# Patient Record
Sex: Female | Born: 1970 | Race: Black or African American | Hispanic: No
Health system: Southern US, Community
[De-identification: ages and names within clinical notes are randomized; demographics above are authoritative.]

## PROBLEM LIST (undated history)

## (undated) ENCOUNTER — Emergency Department (HOSPITAL_BASED_OUTPATIENT_CLINIC_OR_DEPARTMENT_OTHER): Admission: EM | Payer: No Typology Code available for payment source | Source: Home / Self Care

## (undated) DIAGNOSIS — I10 Essential (primary) hypertension: Secondary | ICD-10-CM

## (undated) DIAGNOSIS — E669 Obesity, unspecified: Secondary | ICD-10-CM

---

## 2011-07-04 ENCOUNTER — Emergency Department (HOSPITAL_BASED_OUTPATIENT_CLINIC_OR_DEPARTMENT_OTHER)
Admission: EM | Admit: 2011-07-04 | Discharge: 2011-07-04 | Disposition: A | Discharge status: Home / Self Care | Payer: Worker's Compensation | Attending: Emergency Medicine | Admitting: Emergency Medicine

## 2011-07-04 ENCOUNTER — Emergency Department (INDEPENDENT_AMBULATORY_CARE_PROVIDER_SITE_OTHER): Payer: Worker's Compensation

## 2011-07-04 ENCOUNTER — Encounter (HOSPITAL_BASED_OUTPATIENT_CLINIC_OR_DEPARTMENT_OTHER): Payer: Self-pay | Admitting: *Deleted

## 2011-07-04 DIAGNOSIS — M899 Disorder of bone, unspecified: Secondary | ICD-10-CM

## 2011-07-04 DIAGNOSIS — W19XXXA Unspecified fall, initial encounter: Secondary | ICD-10-CM

## 2011-07-04 DIAGNOSIS — M25469 Effusion, unspecified knee: Secondary | ICD-10-CM

## 2011-07-04 DIAGNOSIS — W010XXA Fall on same level from slipping, tripping and stumbling without subsequent striking against object, initial encounter: Secondary | ICD-10-CM | POA: Insufficient documentation

## 2011-07-04 DIAGNOSIS — M25569 Pain in unspecified knee: Secondary | ICD-10-CM

## 2011-07-04 DIAGNOSIS — M25579 Pain in unspecified ankle and joints of unspecified foot: Secondary | ICD-10-CM | POA: Insufficient documentation

## 2011-07-04 DIAGNOSIS — Y9329 Activity, other involving ice and snow: Secondary | ICD-10-CM | POA: Insufficient documentation

## 2011-07-04 HISTORY — DX: Essential (primary) hypertension: I10

## 2011-07-04 MED ORDER — HYDROCODONE-ACETAMINOPHEN 5-325 MG PO TABS: 2.0000 | ORAL_TABLET | Freq: Four times a day (QID) | ORAL | Status: AC | PRN

## 2011-07-04 MED ORDER — IBUPROFEN 600 MG PO TABS: 600.0000 mg | ORAL_TABLET | Freq: Four times a day (QID) | ORAL | Status: AC | PRN

## 2011-07-04 MED ORDER — HYDROCODONE-ACETAMINOPHEN 5-325 MG PO TABS
1.0000 | ORAL_TABLET | Freq: Once | ORAL | Status: AC
  Administered 2011-07-04: 1 via ORAL
  Filled 2011-07-04: qty 1

## 2011-07-04 NOTE — ED Notes (Signed)
States slipped on ice at work yesterday- c/o right knee pain

## 2011-07-04 NOTE — ED Provider Notes (Signed)
History   This chart was scribed for Forbes Cellar, MD by Charolett Bumpers . The patient was seen in room [facility] and the patient's care was started at 7:39pm.   CSN: [id number]  Arrival date & time 07/04/11  1743   First MD Initiated Contact with Patient 07/04/11 1921      Chief Complaint  Patient presents with  . Knee Pain  . Fall    (Consider location/radiation/quality/duration/timing/severity/associated sxs/prior treatment) HPI Nicole Graham is a 41 y.o. female who presents to the Emergency Department complaining of constant, moderate right knee pain after a fall that occurred yesterday. Patient reports that she slipped on ice, causing her to fall. Patient rates right knee pain as 9/10 and states it radiates to her right ankle. Patient denies numbness, tingling, or weakness in her extremities. Patient denies LOC or head trauma. Patient reports being able to ambulate some. Patient also reports associated right knee swelling. Patient has not taken anything for her knee pain. Ambulatory with a limp.   ED Notes, ED Provider Notes from 07/04/11 0000 to 07/04/11 18:11:02       Joss Stoney Bang, RN 07/04/2011 18:06      States slipped on ice at work yesterday- c/o right knee pain      Past Medical History  Diagnosis Date  . Hypertension     Past Surgical History  Procedure Date  . Cesarean section     History reviewed. No pertinent family history.  History  Substance Use Topics  . Smoking status: Never Smoker   . Smokeless tobacco: Not on file  . Alcohol Use: Yes     occasional    OB History    Grav Para Term Preterm Abortions TAB SAB Ect Mult Living                  Review of Systems A complete 10 system review of systems was obtained and is otherwise negative except as noted in the HPI and PMH.   Allergies  Review of patient's allergies indicates no known allergies.  Home Medications   Current Outpatient Rx  Name Route Sig Dispense Refill  .  LISINOPRIL-HYDROCHLOROTHIAZIDE 20-25 MG PO TABS Oral Take by mouth daily.    Marland Kitchen NIFEDIPINE ER OSMOTIC 60 MG PO TB24 Oral Take 60 mg by mouth 2 (two) times daily.    Marland Kitchen HYDROCODONE-ACETAMINOPHEN 5-325 MG PO TABS Oral Take 2 tablets by mouth every 6 (six) hours as needed for pain. 10 tablet 0  . IBUPROFEN 600 MG PO TABS Oral Take 1 tablet (600 mg total) by mouth every 6 (six) hours as needed for pain. 30 tablet 0    BP 119/78  Pulse 87  Temp(Src) 99.4 F (37.4 C) (Oral)  Resp 20  Ht 5\' 3"  (1.6 m)  Wt 280 lb (127.007 kg)  BMI 49.60 kg/m2  SpO2 100%  LMP 07/02/2011  Physical Exam  Nursing note and vitals reviewed. Constitutional: She is oriented to person, place, and time. She appears well-developed and well-nourished. No distress.  HENT:  Head: Normocephalic and atraumatic.  Eyes: EOM are normal. Pupils are equal, round, and reactive to light.  Neck: Normal range of motion. Neck supple. No tracheal deviation present.  Cardiovascular: Normal rate, regular rhythm and normal heart sounds.  Exam reveals no gallop and no friction rub.   No murmur heard. Pulmonary/Chest: Effort normal and breath sounds normal. No respiratory distress. She has no wheezes. She has no rales.  Abdominal: Soft. Bowel sounds are normal.  She exhibits no distension and no mass. There is no tenderness. There is no rebound and no guarding.  Musculoskeletal: Normal range of motion. She exhibits no edema.       Swelling to right medial knee. Right knee is tender to palpation at medial joint line. No instability. Swelling to right ankle lateral malleolus.  Achilles tendon intact. Minimal ecchymosis to right ankle.  Distal pulses intact. Cap refill < 3 sec. Gross sensation intact  Neurological: She is alert and oriented to person, place, and time. No sensory deficit.  Skin: Skin is warm and dry.  Psychiatric: She has a normal mood and affect. Her behavior is normal.    ED Course  Procedures (including critical care  time)  DIAGNOSTIC STUDIES: Oxygen Saturation is 100% on room air, normal by my interpretation.    COORDINATION OF CARE:  1945: Medication Orders: Hydrocodone-acetaminophen    Labs Reviewed - No data to display Dg Ankle Complete Right  07/04/2011  *RADIOLOGY REPORT*  Clinical Data: 41 year old female status post fall with pain.  RIGHT ANKLE - COMPLETE 3+ VIEW  Comparison: None.  Findings: No definite joint effusion.  Pes planus.  Degenerative changes about the talus.  Mortise joint alignment preserved.  Talar dome intact.  No acute fracture identified.  IMPRESSION: No acute fracture or dislocation identified about the right ankle. Degenerative changes noted.  Original Report Authenticated By: Harley Hallmark, M.D.   Dg Knee Complete 4 Views Right  07/04/2011  *RADIOLOGY REPORT*  Clinical Data: 41 year old female status post fall with pain.  RIGHT KNEE - COMPLETE 4+ VIEW  Comparison: None.  Findings: Evidence of suprapatellar joint effusion.  The patella appears intact.  Joint spaces are preserved but there is a degree of tricompartmental degenerative spurring.  No acute fracture identified.  IMPRESSION: Joint effusion.  Degenerative spurring.  No acute fracture identified about the right knee.  Original Report Authenticated By: Ulla Potash III, M.D.    1. Knee pain   2. Ankle pain   3. Fall     MDM  S/p mechanical fall with ankle pain, knee pain. No apparent fx on XR. RICE/pain control. PMD f/u as needed. Precautions for return.  I personally performed the services described in this documentation, which was scribed in my presence. The recorded information has been reviewed and considered.       Forbes Cellar, MD 07/04/11 [phone]

## 2012-09-23 ENCOUNTER — Emergency Department (HOSPITAL_BASED_OUTPATIENT_CLINIC_OR_DEPARTMENT_OTHER)
Admission: EM | Admit: 2012-09-23 | Discharge: 2012-09-23 | Disposition: A | Discharge status: Home / Self Care | Payer: Managed Care, Other (non HMO) | Attending: Emergency Medicine | Admitting: Emergency Medicine

## 2012-09-23 ENCOUNTER — Emergency Department (HOSPITAL_BASED_OUTPATIENT_CLINIC_OR_DEPARTMENT_OTHER): Payer: Managed Care, Other (non HMO)

## 2012-09-23 ENCOUNTER — Encounter (HOSPITAL_BASED_OUTPATIENT_CLINIC_OR_DEPARTMENT_OTHER): Payer: Self-pay

## 2012-09-23 DIAGNOSIS — Z79899 Other long term (current) drug therapy: Secondary | ICD-10-CM | POA: Insufficient documentation

## 2012-09-23 DIAGNOSIS — M549 Dorsalgia, unspecified: Secondary | ICD-10-CM | POA: Insufficient documentation

## 2012-09-23 DIAGNOSIS — E669 Obesity, unspecified: Secondary | ICD-10-CM | POA: Insufficient documentation

## 2012-09-23 DIAGNOSIS — K219 Gastro-esophageal reflux disease without esophagitis: Secondary | ICD-10-CM | POA: Insufficient documentation

## 2012-09-23 DIAGNOSIS — R072 Precordial pain: Secondary | ICD-10-CM | POA: Insufficient documentation

## 2012-09-23 DIAGNOSIS — Z3202 Encounter for pregnancy test, result negative: Secondary | ICD-10-CM | POA: Insufficient documentation

## 2012-09-23 DIAGNOSIS — I10 Essential (primary) hypertension: Secondary | ICD-10-CM | POA: Insufficient documentation

## 2012-09-23 HISTORY — DX: Obesity, unspecified: E66.9

## 2012-09-23 LAB — CBC
MCHC: 33.1 g/dL (ref 30.0–36.0)
Platelets: 365 10*3/uL (ref 150–400)
RDW: 14.3 % (ref 11.5–15.5)
WBC: 8.1 10*3/uL (ref 4.0–10.5)

## 2012-09-23 LAB — PREGNANCY, URINE: Preg Test, Ur: NEGATIVE

## 2012-09-23 LAB — BASIC METABOLIC PANEL
Chloride: 101 mEq/L (ref 96–112)
GFR calc Af Amer: >90 mL/min (ref >90)
GFR calc non Af Amer: 79 mL/min — ABNORMAL LOW (ref >90)
Potassium: 4.8 mEq/L (ref 3.5–5.1)
Sodium: 136 mEq/L (ref 135–145)

## 2012-09-23 LAB — LIPASE, BLOOD: Lipase: 25 U/L (ref 11–59)

## 2012-09-23 LAB — TROPONIN I: Troponin I: <0.3 ng/mL (ref <0.30)

## 2012-09-23 MED ORDER — FAMOTIDINE 20 MG PO TABS: 20.0000 mg | ORAL_TABLET | Freq: Two times a day (BID) | ORAL | Status: AC

## 2012-09-23 MED ORDER — GI COCKTAIL ~~LOC~~
30.0000 mL | Freq: Once | ORAL | Status: AC
  Administered 2012-09-23: 30 mL via ORAL
  Filled 2012-09-23: qty 30

## 2012-09-23 MED ORDER — ALUM & MAG HYDROXIDE-SIMETH 200-200-20 MG/5ML PO SUSP: 10.0000 mL | Freq: Four times a day (QID) | ORAL | Status: AC | PRN

## 2012-09-23 MED ORDER — ASPIRIN 81 MG PO CHEW
324.0000 mg | CHEWABLE_TABLET | Freq: Once | ORAL | Status: AC
  Administered 2012-09-23: 324 mg via ORAL
  Filled 2012-09-23: qty 4

## 2012-09-23 NOTE — ED Notes (Signed)
Pt states that she had onset of sharp shooting chest pain, radiating to the back at about 0900 this morning.  Pt states that she called PCP and they advised her to take Tums, or Gas X to see if it relieved the pain.  Pt did drink ginger ale reported some improvement after drinking it. No pain at present, family hx of heart dz and MI.

## 2012-09-23 NOTE — ED Provider Notes (Signed)
History     CSN: [id number]  Arrival date & time 09/23/12  1551   First MD Initiated Contact with Patient 09/23/12 1609      Chief Complaint  Patient presents with  . Chest Pain    (Consider location/radiation/quality/duration/timing/severity/associated sxs/prior treatment) HPI Comments: Patient is a 42 year old woman who complains of chest pain. She says she has a had a sharp pain in the lower substernal region that went through to her back. This happened around 9 this morning. His last briefs. One or 2 seconds. She had 2 more times than this morning. She tried ginger ale to see if appropriate would help and that helped a little bit. She called her doctor's office, and was advised that it could be a number of things, such as esophageal reflux or heart trouble. Her mother died of a heart attack as did her maternal grandmother. Therefore she sought evaluation. She has no history of heart disease, but does have hypertension.  Patient is a 42 y.o. female presenting with chest pain. The history is provided by the patient. No language interpreter was used.  Chest Pain Pain location:  Substernal area Pain quality: sharp   Pain radiates to:  Mid back Pain radiates to the back: yes   Pain severity:  Moderate Onset quality:  Sudden Duration: 5-10 seconds. Timing:  Intermittent Progression:  Unchanged Chronicity:  New Context comment:  No apparent precipitating factor. Relieved by:  Nothing Worsened by:  Nothing tried Ineffective treatments:  None tried (She took ginger ale, which helped her to burp but didn't really relieve her pain.) Associated symptoms: back pain   Associated symptoms: no fever   Risk factors: hypertension   Risk factors: no smoking     Past Medical History  Diagnosis Date  . Hypertension   . Obesity     Past Surgical History  Procedure Laterality Date  . Cesarean section      History reviewed. No pertinent family history.  History  Substance Use Topics   . Smoking status: Passive Smoke Exposure - Never Smoker  . Smokeless tobacco: Never Used  . Alcohol Use: Yes     Comment: occasional    OB History   Grav Para Term Preterm Abortions TAB SAB Ect Mult Living                  Review of Systems  Constitutional: Negative.  Negative for fever and chills.  HENT: Negative.   Eyes: Negative.   Respiratory: Negative.   Cardiovascular: Positive for chest pain.  Gastrointestinal: Negative.   Genitourinary: Negative.   Musculoskeletal: Positive for back pain.  Skin: Negative.   Neurological: Negative.   Psychiatric/Behavioral: Negative.     Allergies  Review of patient's allergies indicates no known allergies.  Home Medications   Current Outpatient Rx  Name  Route  Sig  Dispense  Refill  . lisinopril-hydrochlorothiazide (PRINZIDE,ZESTORETIC) 20-25 MG per tablet   Oral   Take by mouth daily.         Marland Kitchen NIFEdipine (PROCARDIA XL/ADALAT-CC) 60 MG 24 hr tablet   Oral   Take 60 mg by mouth 2 (two) times daily.           BP 145/91  Pulse 101  Temp(Src) 98.9 F (37.2 C) (Oral)  Resp 20  Ht 5\' 3"  (1.6 m)  Wt 270 lb (122.471 kg)  BMI 47.84 kg/m2  SpO2 100%  LMP 09/04/2012  Physical Exam  Nursing note and vitals reviewed. Constitutional: She is oriented to  person, place, and time. No distress.  BP 145/91. Patient is a middle-aged woman who is mild to moderately obese.  HENT:  Head: Normocephalic and atraumatic.  Right Ear: External ear normal.  Left Ear: External ear normal.  Mouth/Throat: Oropharynx is clear and moist.  Eyes: Conjunctivae and EOM are normal. Pupils are equal, round, and reactive to light.  Neck: Normal range of motion. Neck supple.  Cardiovascular: Normal rate, regular rhythm and normal heart sounds.   Pulmonary/Chest: Effort normal and breath sounds normal. She exhibits no tenderness.  Abdominal: Soft. Bowel sounds are normal.  Musculoskeletal: Normal range of motion. She exhibits no edema and no  tenderness.  Neurological: She is alert and oriented to person, place, and time.  No sensory or motor deficit.  Skin: Skin is warm and dry.  Psychiatric: She has a normal mood and affect. Her behavior is normal.    ED Course  Procedures (including critical care time)  Labs Reviewed  CBC  BASIC METABOLIC PANEL  TROPONIN I  PREGNANCY, URINE   4:10 PM  Date: 09/23/2012  Rate: 94  Rhythm: normal sinus rhythm  QRS Axis: right  Intervals: normal QRS:  Poor R wave progression in precordial leads suggests possible old anterior myocardial infarction. Left atrial enlargement.  ST/T Wave abnormalities: normal  Conduction Disutrbances:none  Narrative Interpretation: Abnormal EKG  Old EKG Reviewed: none available  4:53 PM She was seen and had physical examination. Laboratory tests were ordered. Baby aspirins were ordered, had a GI cocktail was ordered.  6:16 PM Results for orders placed during the hospital encounter of 09/23/12  CBC      Result Value Range   WBC 8.1  4.0 - 10.5 K/uL   RBC 4.29  3.87 - 5.11 MIL/uL   Hemoglobin 11.9 (*) 12.0 - 15.0 g/dL   HCT 56.2  13.0 - 86.5 %   MCV 83.9  78.0 - 100.0 fL   MCH 27.7  26.0 - 34.0 pg   MCHC 33.1  30.0 - 36.0 g/dL   RDW 78.4  69.6 - 29.5 %   Platelets 365  150 - 400 K/uL  BASIC METABOLIC PANEL      Result Value Range   Sodium 136  135 - 145 mEq/L   Potassium 4.8  3.5 - 5.1 mEq/L   Chloride 101  96 - 112 mEq/L   CO2 23  19 - 32 mEq/L   Glucose, Bld 99  70 - 99 mg/dL   BUN 16  6 - 23 mg/dL   Creatinine, Ser 2.84  0.50 - 1.10 mg/dL   Calcium 9.4  8.4 - 13.2 mg/dL   GFR calc non Af Amer 79 (*) >90 mL/min   GFR calc Af Amer >90  >90 mL/min  TROPONIN I      Result Value Range   Troponin I <0.30  <0.30 ng/mL  PREGNANCY, URINE      Result Value Range   Preg Test, Ur NEGATIVE  NEGATIVE  LIPASE, BLOOD      Result Value Range   Lipase 25  11 - 59 U/L   Dg Chest 2 View  09/23/2012  *RADIOLOGY REPORT*  Clinical Data: Chest pain   CHEST - 2 VIEW  Comparison: None.  Findings: The lungs are clear without focal consolidation, edema, effusion or pneumothorax.  Cardiopericardial silhouette is within normal limits for size.  Telemetry leads overlie the chest. Imaged bony structures of the thorax are intact.  IMPRESSION: Normal exam.   Original Report Authenticated By:  Kennith Center, M.D.     6:16 PM Lab tests entirely negative.  I suspect her symptoms are from esophageal reflux.  Recommended Pepcid 20 mg bid, Mylanta 10 cc pc and hs.   1. GERD (gastroesophageal reflux disease)          Carleene Cooper III, MD 09/23/12 [phone]

## 2013-05-01 IMAGING — CR DG ANKLE COMPLETE 3+V*R*
4 series · 4 of 4 positions shown · non-contrast
Comparison: None.

CLINICAL DATA: 40-year-old female status post fall with pain.

RIGHT ANKLE - COMPLETE 3+ VIEW

[t ankle joint ap right]
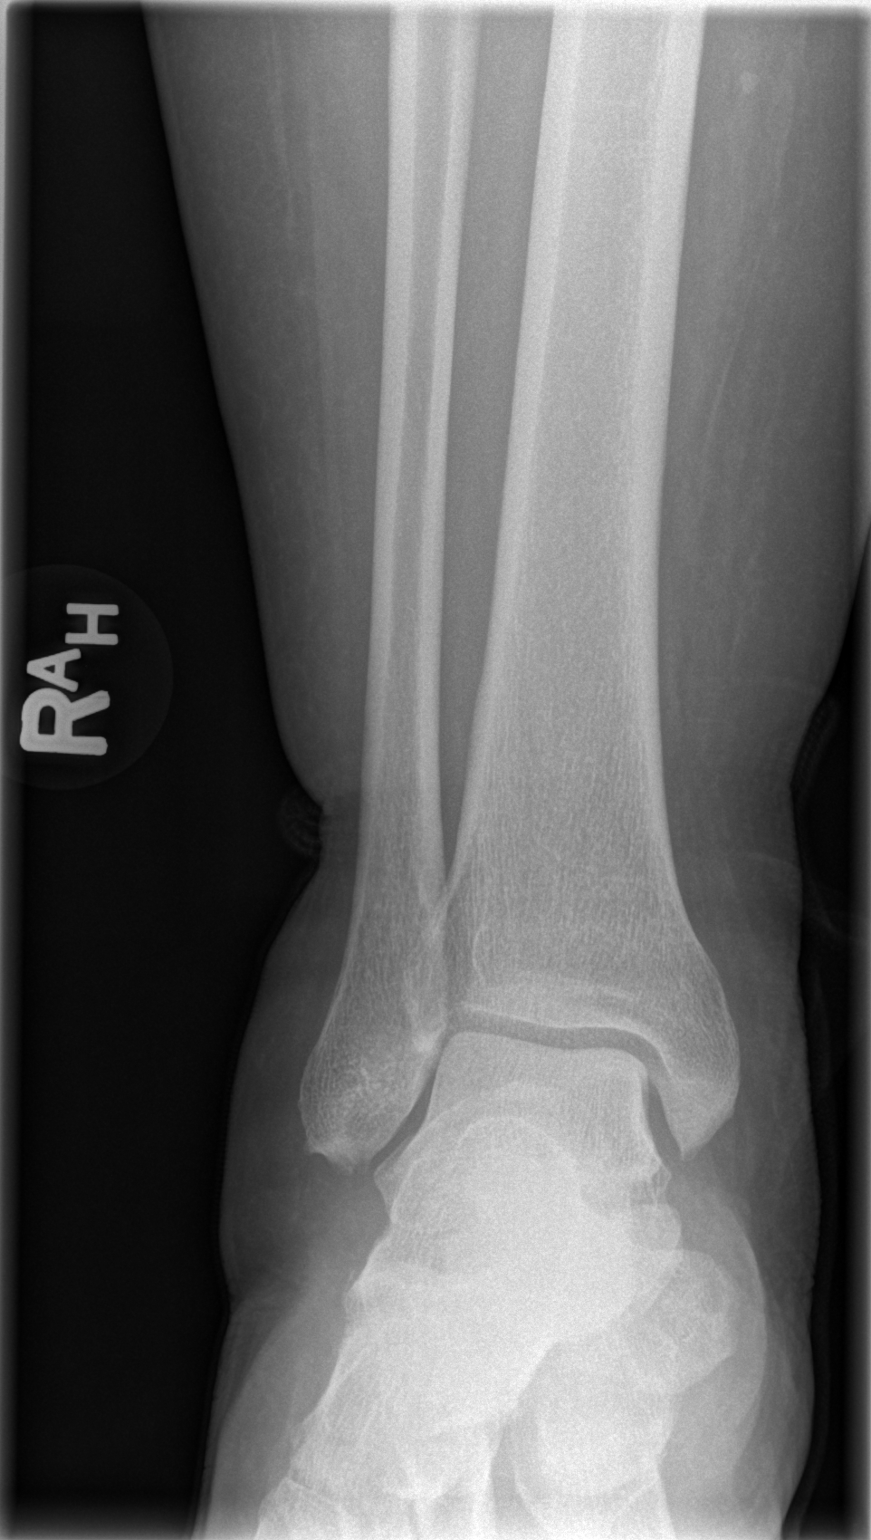

[t ankle joint oblique right (1 of 2)]
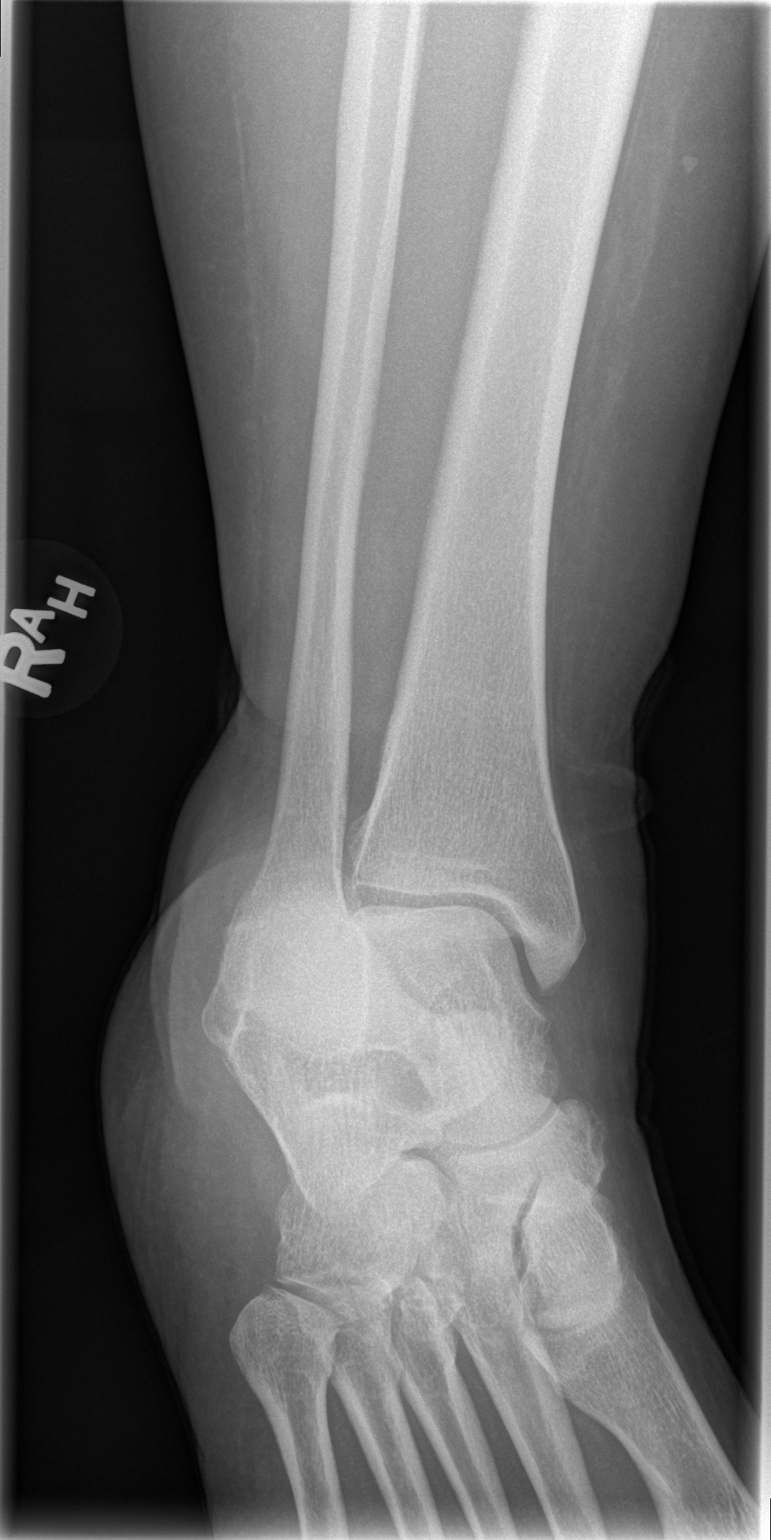

[t ankle joint oblique right (2 of 2)]
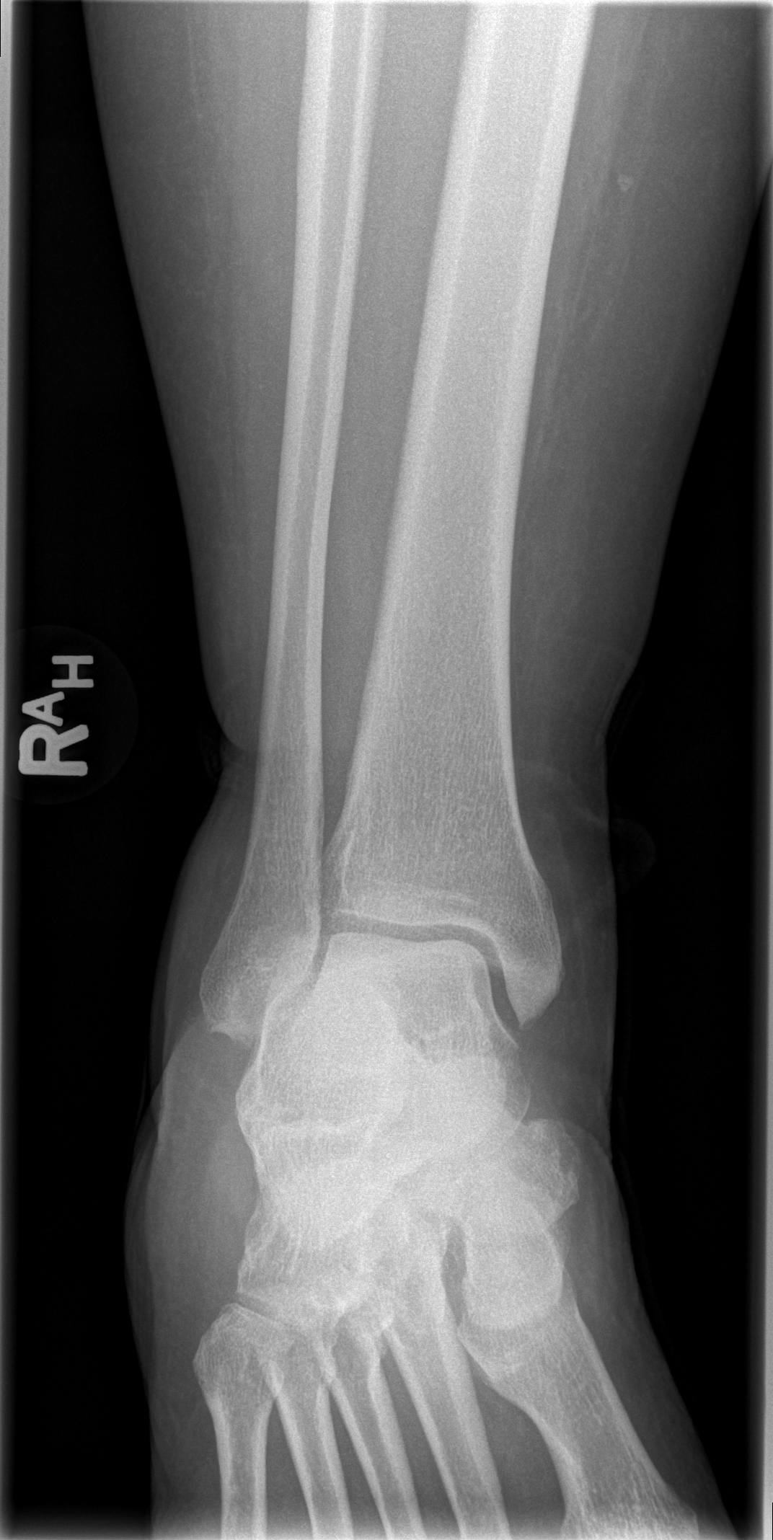

[t ankle joint lat right]
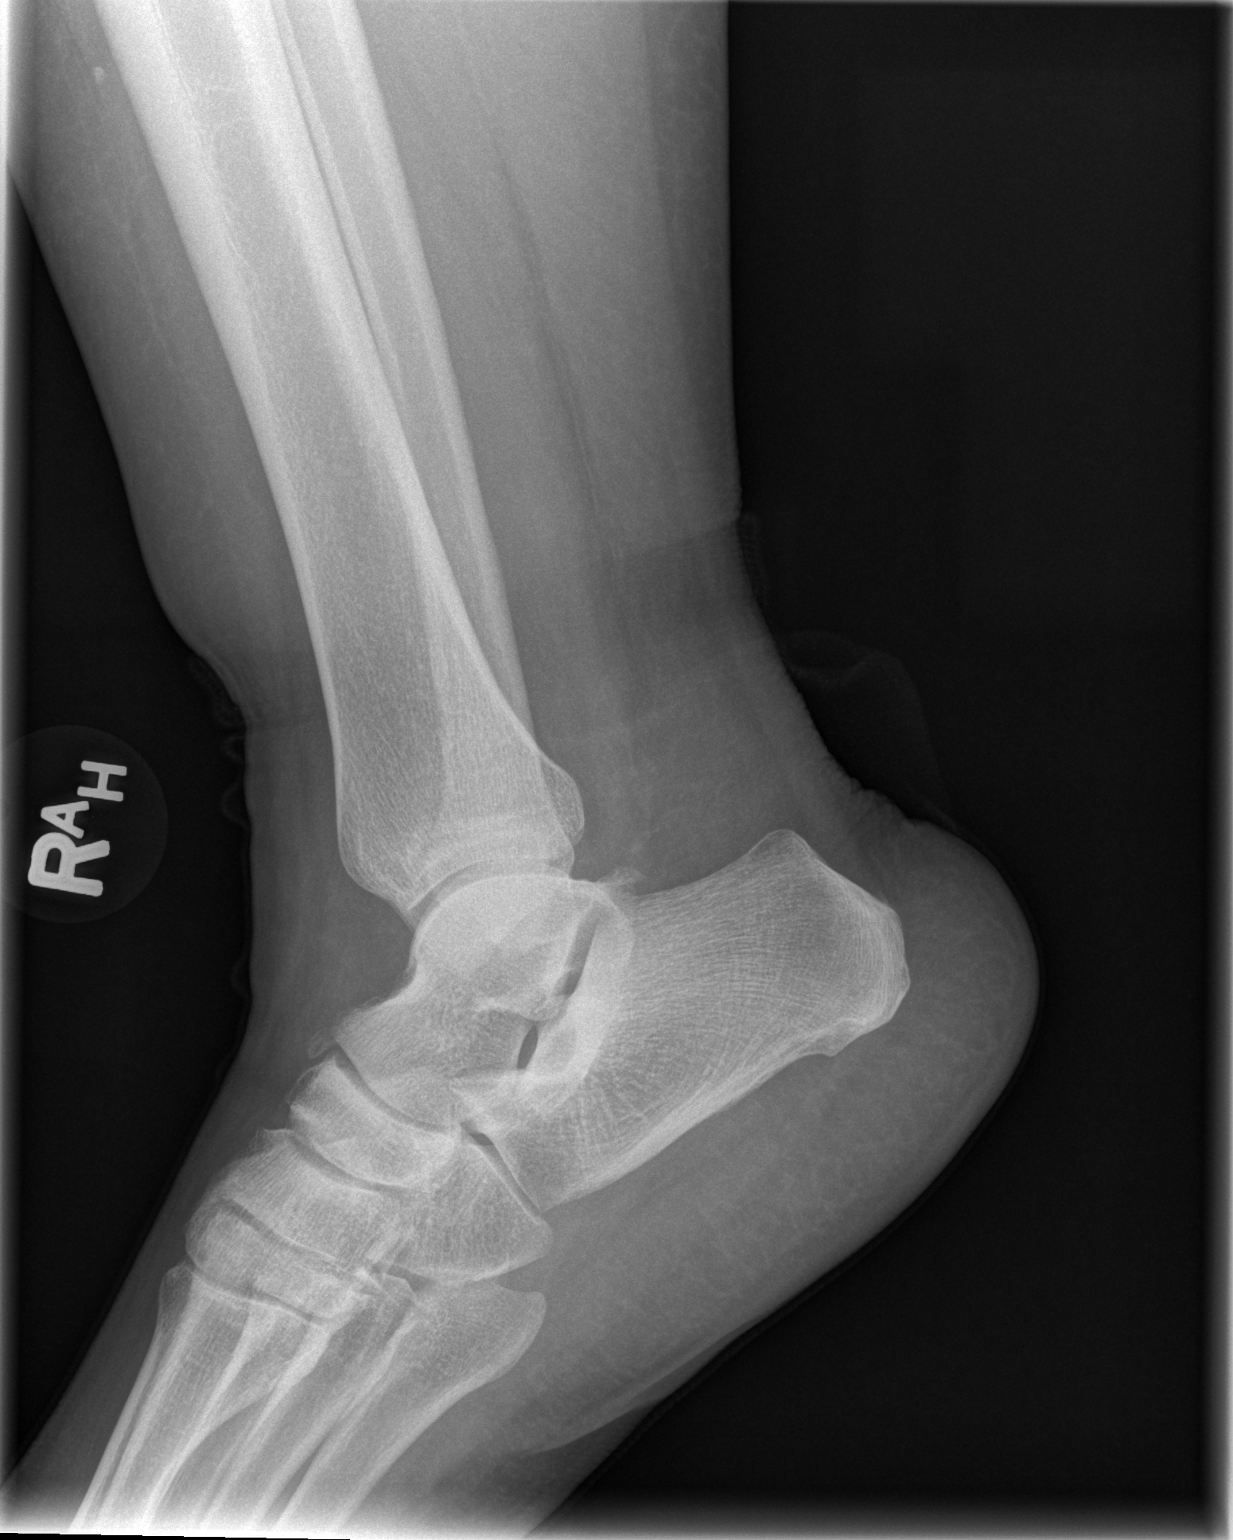

[4 of 4 positions shown; findings below may reference images not displayed]

FINDINGS: No definite joint effusion.  Pes planus.  Degenerative
changes about the talus.  Mortise joint alignment preserved.  Talar
dome intact.  No acute fracture identified.
IMPRESSION: No acute fracture or dislocation identified about the right ankle.
Degenerative changes noted.

## 2014-08-03 ENCOUNTER — Emergency Department (HOSPITAL_BASED_OUTPATIENT_CLINIC_OR_DEPARTMENT_OTHER)
Admission: EM | Admit: 2014-08-03 | Discharge: 2014-08-03 | Disposition: A | Discharge status: Home / Self Care | Payer: Managed Care, Other (non HMO) | Attending: Emergency Medicine | Admitting: Emergency Medicine

## 2014-08-03 ENCOUNTER — Encounter (HOSPITAL_BASED_OUTPATIENT_CLINIC_OR_DEPARTMENT_OTHER): Payer: Self-pay

## 2014-08-03 DIAGNOSIS — J069 Acute upper respiratory infection, unspecified: Secondary | ICD-10-CM | POA: Diagnosis not present

## 2014-08-03 DIAGNOSIS — J029 Acute pharyngitis, unspecified: Secondary | ICD-10-CM | POA: Insufficient documentation

## 2014-08-03 DIAGNOSIS — E669 Obesity, unspecified: Secondary | ICD-10-CM | POA: Insufficient documentation

## 2014-08-03 DIAGNOSIS — Z79899 Other long term (current) drug therapy: Secondary | ICD-10-CM | POA: Diagnosis not present

## 2014-08-03 DIAGNOSIS — I10 Essential (primary) hypertension: Secondary | ICD-10-CM | POA: Diagnosis not present

## 2014-08-03 LAB — RAPID STREP SCREEN (MED CTR MEBANE ONLY): Streptococcus, Group A Screen (Direct): NEGATIVE

## 2014-08-03 MED ORDER — AZITHROMYCIN 250 MG PO TABS: 250.0000 mg | ORAL_TABLET | Freq: Every day | ORAL | Status: AC

## 2014-08-03 NOTE — ED Provider Notes (Signed)
CSN: [id number]     Arrival date & time 08/03/14  1348 History   First MD Initiated Contact with Patient 08/03/14 1403     Chief Complaint  Patient presents with  . Sore Throat     (Consider location/radiation/quality/duration/timing/severity/associated sxs/prior Treatment) Patient is a 44 y.o. female presenting with pharyngitis. The history is provided by the patient.  Sore Throat This is a new problem. The current episode started yesterday. The problem occurs constantly. The problem has been gradually worsening. Associated symptoms include chills, congestion, a fever, a sore throat and swollen glands. Pertinent negatives include no abdominal pain, coughing, headaches, myalgias, nausea, rash or vomiting. The symptoms are aggravated by swallowing and eating. She has tried nothing for the symptoms.   Nicole Larssonundrea Graham is a 44 y.o. female who presents to the ED with sore throat and nasal congestion that has been going on for the past 2 days. 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   Past Medical History  Diagnosis Date  . Hypertension   . Obesity    Past Surgical History  Procedure Laterality Date  . Cesarean section     History reviewed. No pertinent family history. History  Substance Use Topics  . Smoking status: Passive Smoke Exposure - Never Smoker  . Smokeless tobacco: Never Used  . Alcohol Use: Yes     Comment: occasional   OB History    No data available     Review of Systems  Constitutional: Positive for fever and chills.  HENT: Positive for congestion and sore throat.   Eyes: Negative for photophobia, redness, itching and visual disturbance.  Respiratory: Negative for cough.   Gastrointestinal: Negative for nausea, vomiting, abdominal pain and diarrhea.  Genitourinary: Negative for dysuria and frequency.  Musculoskeletal: Negative for myalgias, back pain and neck stiffness.  Skin: Negative for rash.  Neurological: Negative for dizziness,  light-headedness and headaches.  Psychiatric/Behavioral: Negative for confusion. The patient is not nervous/anxious.       Allergies  Review of patient's allergies indicates no known allergies.  Home Medications   Prior to Admission medications   Medication Sig Start Date End Date Taking? Authorizing Provider  lisinopril-hydrochlorothiazide (PRINZIDE,ZESTORETIC) 20-25 MG per tablet Take by mouth daily.   Yes Historical Provider, MD  NIFEdipine (PROCARDIA XL/ADALAT-CC) 60 MG 24 hr tablet Take 60 mg by mouth 2 (two) times daily.   Yes Historical Provider, MD  alum & mag hydroxide-simeth (MYLANTA) 200-200-20 MG/5ML suspension Take 10 mLs by mouth every 6 (six) hours as needed for indigestion (Take 2 teaspoons after meals and at bedtime.  Buy over-the-counter.). 09/23/12   Carleene CooperAlan Davidson, MD  azithromycin (ZITHROMAX) 250 MG tablet Take 1 tablet (250 mg total) by mouth daily. Take first 2 tablets together, then 1 every day until finished. 08/03/14   Jaydon Soroka Orlene OchM Lakiya Cottam, NP  famotidine (PEPCID) 20 MG tablet Take 1 tablet (20 mg total) by mouth 2 (two) times daily. 09/23/12   Carleene CooperAlan Davidson, MD   BP 128/79 mmHg  Pulse 94  Temp(Src) 98.9 F (37.2 C) (Oral)  Resp 18  Ht 5\' 4"  (1.626 m)  Wt 257 lb (116.574 kg)  BMI 44.09 kg/m2  SpO2 99%  LMP 08/02/2014 Physical Exam  Constitutional: She is oriented to person, place, and time. She appears well-developed and well-nourished.  HENT:  Head: Normocephalic.  Right Ear: Tympanic membrane normal.  Left Ear: Tympanic membrane normal.  Nose: Rhinorrhea present.  Mouth/Throat: Uvula is midline and mucous membranes are  normal. Posterior oropharyngeal erythema present.  Eyes: Conjunctivae and EOM are normal.  Neck: Normal range of motion. Neck supple.  Cardiovascular: Normal rate and regular rhythm.   Pulmonary/Chest: Effort normal. No respiratory distress. She has no wheezes. She has no rales.  Abdominal: Soft. Bowel sounds are normal. There is no tenderness.   Musculoskeletal: Normal range of motion.  Lymphadenopathy:    She has no cervical adenopathy.  Neurological: She is alert and oriented to person, place, and time. No cranial nerve deficit.  Skin: Skin is warm and dry.  Psychiatric: She has a normal mood and affect. Her behavior is normal.  Nursing note and vitals reviewed.   ED Course  Procedures (including critical care time) Labs Review Results for orders placed or performed during the hospital encounter of 08/03/14 (from the past 24 hour(s))  Rapid strep screen     Status: None   Collection Time: 08/03/14  1:50 PM  Result Value Ref Range   Streptococcus, Group A Screen (Direct) NEGATIVE NEGATIVE    Labs Reviewed  RAPID STREP SCREEN  CULTURE, GROUP A STREP     MDM  45 y.o. female with sore throat, nasal congestion and upper respiratory symptoms x 2 days. Stable for d/c without respiratory distress. O2 SAT 99% on R/A. She will follow up with her PCP or return here as needed for worsening symptoms. Discussed with the patient and all questioned fully answered.   Final diagnoses:  Pharyngitis  URI (upper respiratory infection)        Janne Napoleon, NP 08/03/14 2012  Audree Camel, MD 08/04/14 [phone]

## 2014-08-03 NOTE — ED Notes (Signed)
Pt reports sore throat, nasal congestion x2 days.

## 2014-08-03 NOTE — Discharge Instructions (Signed)
Continue to take the Mucinex and tylenol or motrin for fever and pain. Take the antibiotics as directed. Follow up with your doctor or return here as needed.

## 2014-08-05 LAB — CULTURE, GROUP A STREP: Strep A Culture: POSITIVE — AB

## 2014-08-14 ENCOUNTER — Telehealth (HOSPITAL_BASED_OUTPATIENT_CLINIC_OR_DEPARTMENT_OTHER): Payer: Self-pay | Admitting: Emergency Medicine

## 2014-08-14 NOTE — Telephone Encounter (Signed)
Post ED Visit - Positive Culture Follow-up  Culture report reviewed by antimicrobial stewardship pharmacist: []  Wes Dulaney, Pharm.D., BCPS [x]  Celedonio MiyamotoJeremy Frens, Pharm.D., BCPS []  Georgina PillionElizabeth Martin, Pharm.D., BCPS []  LaureldaleMinh Pham, [place]Pharm[place]D., BCPS, AAHIVP []  Estella HuskMichelle Turner, Pharm.D., BCPS, AAHIVP []  Elder CyphersLorie Poole, [address] BoulevardPharm.D., BCPS  Positive strep culture Treated with azithromycin, organism sensitive to the same and no further patient follow-up is required at this time.  Berle MullMiller, Yasmin Bronaugh 08/14/2014, 3:02 PM

## 2024-02-08 ENCOUNTER — Other Ambulatory Visit: Payer: Self-pay | Admitting: Specialist

## 2024-02-08 DIAGNOSIS — Z8249 Family history of ischemic heart disease and other diseases of the circulatory system: Secondary | ICD-10-CM

## 2024-03-03 ENCOUNTER — Ambulatory Visit
Admission: RE | Admit: 2024-03-03 | Discharge: 2024-03-03 | Disposition: A | Discharge status: Home / Self Care | Source: Ambulatory Visit | Attending: Specialist | Admitting: Specialist

## 2024-03-03 DIAGNOSIS — Z8249 Family history of ischemic heart disease and other diseases of the circulatory system: Secondary | ICD-10-CM
